# Patient Record
Sex: Male | Born: 1990 | Hispanic: Yes | Marital: Married
Health system: Southern US, Community
[De-identification: ages and names within clinical notes are randomized; demographics above are authoritative.]

---

## 2017-02-04 ENCOUNTER — Emergency Department
Admission: EM | Admit: 2017-02-04 | Discharge: 2017-02-04 | Disposition: A | Discharge status: Home / Self Care | Payer: Self-pay | Attending: Emergency Medicine | Admitting: Emergency Medicine

## 2017-02-04 ENCOUNTER — Emergency Department: Payer: Self-pay

## 2017-02-04 DIAGNOSIS — E1165 Type 2 diabetes mellitus with hyperglycemia: Secondary | ICD-10-CM | POA: Insufficient documentation

## 2017-02-04 DIAGNOSIS — F10921 Alcohol use, unspecified with intoxication delirium: Secondary | ICD-10-CM | POA: Insufficient documentation

## 2017-02-04 DIAGNOSIS — R739 Hyperglycemia, unspecified: Secondary | ICD-10-CM

## 2017-02-04 DIAGNOSIS — F1999 Other psychoactive substance use, unspecified with unspecified psychoactive substance-induced disorder: Secondary | ICD-10-CM | POA: Insufficient documentation

## 2017-02-04 DIAGNOSIS — F12988 Cannabis use, unspecified with other cannabis-induced disorder: Secondary | ICD-10-CM

## 2017-02-04 DIAGNOSIS — Z7984 Long term (current) use of oral hypoglycemic drugs: Secondary | ICD-10-CM | POA: Insufficient documentation

## 2017-02-04 LAB — GLUCOSE, CAPILLARY: GLUCOSE-CAPILLARY: 230 mg/dL — AB (ref 65–99)

## 2017-02-04 LAB — URINE DRUG SCREEN, QUALITATIVE (ARMC ONLY)
Amphetamines, Ur Screen: NOT DETECTED
BARBITURATES, UR SCREEN: NOT DETECTED
BENZODIAZEPINE, UR SCRN: NOT DETECTED
Cannabinoid 50 Ng, Ur ~~LOC~~: POSITIVE — AB
Cocaine Metabolite,Ur ~~LOC~~: NOT DETECTED
MDMA (Ecstasy)Ur Screen: NOT DETECTED
METHADONE SCREEN, URINE: NOT DETECTED
OPIATE, UR SCREEN: NOT DETECTED
Phencyclidine (PCP) Ur S: NOT DETECTED
Tricyclic, Ur Screen: NOT DETECTED

## 2017-02-04 LAB — CBC
HEMATOCRIT: 46.2 % (ref 40.0–52.0)
HEMOGLOBIN: 15.5 g/dL (ref 13.0–18.0)
MCH: 31.5 pg (ref 26.0–34.0)
MCHC: 33.6 g/dL (ref 32.0–36.0)
MCV: 93.8 fL (ref 80.0–100.0)
Platelets: 256 10*3/uL (ref 150–440)
RBC: 4.92 MIL/uL (ref 4.40–5.90)
RDW: 12.7 % (ref 11.5–14.5)
WBC: 11.2 10*3/uL — ABNORMAL HIGH (ref 3.8–10.6)

## 2017-02-04 LAB — COMPREHENSIVE METABOLIC PANEL
ALBUMIN: 4.7 g/dL (ref 3.5–5.0)
ALK PHOS: 53 U/L (ref 38–126)
ALT: 27 U/L (ref 17–63)
ANION GAP: 10 (ref 5–15)
AST: 45 U/L — ABNORMAL HIGH (ref 15–41)
BILIRUBIN TOTAL: 0.5 mg/dL (ref 0.3–1.2)
BUN: 11 mg/dL (ref 6–20)
CALCIUM: 8.8 mg/dL — AB (ref 8.9–10.3)
CO2: 27 mmol/L (ref 22–32)
Chloride: 100 mmol/L — ABNORMAL LOW (ref 101–111)
Creatinine, Ser: 1.24 mg/dL (ref 0.61–1.24)
GLUCOSE: 385 mg/dL — AB (ref 65–99)
Potassium: 4.2 mmol/L (ref 3.5–5.1)
Sodium: 137 mmol/L (ref 135–145)
TOTAL PROTEIN: 7.9 g/dL (ref 6.5–8.1)

## 2017-02-04 LAB — ETHANOL: ALCOHOL ETHYL (B): 209 mg/dL — AB (ref <5)

## 2017-02-04 MED ORDER — SODIUM CHLORIDE 0.9 % IV BOLUS (SEPSIS)
1000.0000 mL | Freq: Once | INTRAVENOUS | Status: AC
  Administered 2017-02-04: 1000 mL via INTRAVENOUS

## 2017-02-04 MED ORDER — METFORMIN HCL 500 MG PO TABS: 500.0000 mg | ORAL_TABLET | Freq: Two times a day (BID) | ORAL | 11 refills | Status: AC

## 2017-02-04 MED ORDER — NALOXONE HCL 2 MG/2ML IJ SOSY: 1.0000 mg | PREFILLED_SYRINGE | Freq: Once | INTRAMUSCULAR | Status: DC

## 2017-02-04 MED ORDER — NALOXONE HCL 2 MG/2ML IJ SOSY
PREFILLED_SYRINGE | INTRAMUSCULAR | Status: AC
  Filled 2017-02-04: qty 2

## 2017-02-04 MED ORDER — ONDANSETRON HCL 4 MG/2ML IJ SOLN
4.0000 mg | Freq: Once | INTRAMUSCULAR | Status: AC
  Administered 2017-02-04: 4 mg via INTRAVENOUS
  Filled 2017-02-04: qty 2

## 2017-02-04 NOTE — ED Notes (Signed)
Discharge instructions reviewd with pt by Dr Roxan Hockey in [place]

## 2017-02-04 NOTE — ED Notes (Signed)
Patient's O2 remaining between 90-93% on room air. Patient placed on 1L [place] with O2 sat of 96%

## 2017-02-04 NOTE — ED Notes (Signed)
Waiting on interpreter to discharge patient
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
## 2017-02-04 NOTE — ED Triage Notes (Signed)
Pt arrived via EMS from home unresponsive. Narcan was given nasally and IV with response. CPR was given to pt by family but was not needed because pt had a pulse. Pt is drunk and has been smoking weed. Pt placed on non-rebreather per EMS. VS per EMS O2-80s  BP-"good". Ammonia was used to help make pt more alert but did not work.

## 2017-02-04 NOTE — ED Notes (Signed)
Patient transported to MRI at this time. 

## 2017-02-04 NOTE — ED Notes (Signed)
Patient taken off of oxygen to obtain RA sat. Will continue to monitor

## 2017-02-04 NOTE — ED Provider Notes (Signed)
[facility] Emergency Department Provider Note    First MD Initiated Contact with Patient 02/04/17 [phone]     (approximate)  I have reviewed the triage vital signs and the nursing notes.  Level V caveat:alcohol intoxication altered mental status HISTORY  Chief Complaint Drug Overdose   HPI Gordon Shaffer is a 26 y.o. male presents to the emergency department via EMS. Per EMS patient was found at home unresponsive with bystander CPR being performed. EMS states that the patient had a palpable pulse on their arrival. Narcan was given nasally and IV with improvement in mental status. Patient denies any drug use patient denies any EtOH ingestion.   There are no active problems to display for this patient. past medical history Unknown   Past surgical history None  Prior to Admission medications   Not on File    Allergies no known drug allergies No family history on file.  Social History Social History  Substance Use Topics  . Smoking status: Not on file  . Smokeless tobacco: Not on file  . Alcohol use Not on file    Review of Systems Constitutional: No fever/chills Eyes: No visual changes. ENT: No sore throat. Cardiovascular: Denies chest pain. Respiratory: Denies shortness of breath. Gastrointestinal: No abdominal pain.  No nausea, no vomiting.  No diarrhea.  No constipation. Genitourinary: Negative for dysuria. Musculoskeletal: Negative for neck pain.  Negative for back pain. Integumentary: Negative for rash. Neurological: Negative for headaches, focal weakness or numbness. Psychiatric:altered mental status   ____________________________________________   PHYSICAL EXAM:  VITAL SIGNS: ED Triage Vitals [02/04/17 0525]  Enc Vitals Group     BP 139/84     Pulse Rate 99     Resp (!) 23     Temp      Temp src      SpO2 (!) 86 %     Weight      Height      Head Circumference      Peak Flow      Pain Score      Pain Loc    Pain Edu?      Excl. in GC?     Constitutional: somnolent but responsive to verbal stimuli. Appears intoxicated Eyes: Conjunctivae are normal.  Head: Atraumatic. Mouth/Throat: Mucous membranes are moist.  Oropharynx non-erythematous. Neck: No stridor.   Cardiovascular: Normal rate, regular rhythm. Good peripheral circulation. Grossly normal heart sounds. Respiratory: Normal respiratory effort.  No retractions. Lungs CTAB. Gastrointestinal: Soft and nontender. No distention.  Musculoskeletal: No lower extremity tenderness nor edema. No gross deformities of extremities. Neurologic:  Normal speech and language. No gross focal neurologic deficits are appreciated.  Skin:  Skin is warm, dry and intact. No rash noted.   ____________________________________________   LABS (all labs ordered are listed, but only abnormal results are displayed)  Labs Reviewed  CBC - Abnormal; Notable for the following:       Result Value   WBC 11.2 (*)    All other components within normal limits  COMPREHENSIVE METABOLIC PANEL - Abnormal; Notable for the following:    Chloride 100 (*)    Glucose, Bld 385 (*)    Calcium 8.8 (*)    AST 45 (*)    All other components within normal limits  ETHANOL - Abnormal; Notable for the following:    Alcohol, Ethyl (B) 209 (*)    All other components within normal limits  URINE DRUG SCREEN, QUALITATIVE ([facility] ONLY) - Abnormal; Notable for the  following:    Cannabinoid 50 Ng, Ur [place] POSITIVE (*)    All other components within normal limits  GLUCOSE, CAPILLARY - Abnormal; Notable for the following:    Glucose-Capillary 230 (*)    All other components within normal limits   ____________________________________________  EKG  ED ECG REPORT I, [place] N BROWN, the attending physician, personally viewed and interpreted this ECG.   Date: 02/04/2017  EKG Time: 5:24 AM  Rate: 101  Rhythm:sinus tachycardia  Axis: normal  Intervals:normal  ST&T Change:  none  ____________________________________________  RADIOLOGY I, [place] N BROWN, personally viewed and evaluated these images (plain radiographs) as part of my medical decision making, as well as reviewing the written report by the radiologist.  Ct Head Wo Contrast  Result Date: 02/04/2017 CLINICAL DATA:  Altered level of consciousness. Patient was found unresponsive. Patient responded to Narcan. EXAM: CT HEAD WITHOUT CONTRAST TECHNIQUE: Contiguous axial images were obtained from the base of the skull through the vertex without intravenous contrast. COMPARISON:  None. FINDINGS: Brain: Focal low-attenuation lesions in the basal ganglia bilaterally likely representing lacunar infarcts or possibly prominent perivascular spaces. Consider MRI for further evaluation to exclude acute process. There is no mass effect or midline shift. No abnormal extra-axial fluid collections. No ventricular dilatation. Gray-white matter junctions are distinct. Basal cisterns are not effaced. No acute intracranial hemorrhage. Vascular: No hyperdense vessel or unexpected calcification. Skull: Normal. Negative for fracture or focal lesion. Sinuses/Orbits: No acute finding. Other: None. IMPRESSION: Focally pains in the basal ganglia bilaterally suggesting lacunar infarcts. Consider MRI for further evaluation to exclude acute process. No acute intracranial hemorrhage or mass effect. Electronically Signed   By: Burman Nieves M.D.   On: 02/04/2017 06:28    ____________________________________________   PROCEDURES  Critical Care performed: CRITICAL CARE Performed by: Darci Current   Total critical care time: 45 minutes  Critical care time was exclusive of separately billable procedures and treating other patients.  Critical care was necessary to treat or prevent imminent or life-threatening deterioration.  Critical care was time spent personally by me on the following activities: development of treatment plan  with patient and/or surrogate as well as nursing, discussions with consultants, evaluation of patient's response to treatment, examination of patient, obtaining history from patient or surrogate, ordering and performing treatments and interventions, ordering and review of laboratory studies, ordering and review of radiographic studies, pulse oximetry and re-evaluation of patient's condition.    .Procedures   ____________________________________________   INITIAL IMPRESSION / ASSESSMENT AND PLAN / ED COURSE  Pertinent labs & imaging results that were available during my care of the patient were reviewed by me and considered in my medical decision making (see chart for details).  26 year old male presenting the emergency department with history of unresponsiveness now responsive to verbal stimuli. Patient appeared intoxicated on arrival which is confirmed on EtOH level of 209. In addition patient's urine drug screen positive for cannabinoids. Of note patient noted to be hyperglycemic with a glucose of 385 onlab data. Patient received 2 L IV normal saline will recheck glucose. Patient's care transferred to Dr. 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[facility])     MEDICATIONS GIVEN DURING THIS VISIT:  Medications  naloxone (NARCAN) injection 1 mg ( Intravenous Canceled Entry 02/04/17 0621)  sodium chloride 0.9 % bolus 1,000 mL (  1,000 mLs Intravenous New Bag/Given 02/04/17 0537)  ondansetron (ZOFRAN) injection 4 mg (4 mg Intravenous Given 02/04/17 0542)  sodium chloride 0.9 % bolus 1,000 mL (1,000 mLs Intravenous New Bag/Given 02/04/17 6433)     NEW OUTPATIENT MEDICATIONS STARTED DURING THIS VISIT:  New Prescriptions   No medications on file    Modified  Medications   No medications on file    Discontinued Medications   No medications on file     Note:  This document was prepared using Dragon voice recognition software and may include unintentional dictation errors.    Darci Current, MD 02/05/17 [phone]

## 2017-02-04 NOTE — ED Notes (Signed)
Pt placed on 4L nasal cannula because O2 in low 80s

## 2018-09-01 IMAGING — MR MR HEAD W/O CM
10 series · 41 of 48 positions shown · non-contrast
Comparison: Head CT 02/04/2017

CLINICAL DATA: Unexplained altered level of consciousness.

EXAM:
MRI HEAD WITHOUT CONTRAST
TECHNIQUE: Multiplanar, multiecho pulse sequences of the brain and surrounding
structures were obtained without intravenous contrast.

[Series 3: T1 · sagittal · 5.0mm · 0.45mm/px · 3 of 29 slices shown (1 of 2)]
[im 1/29]
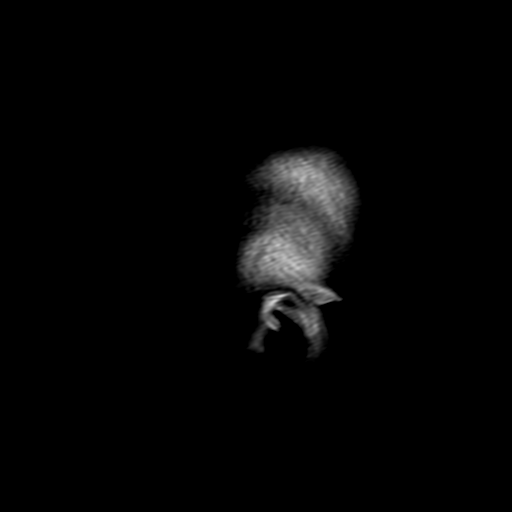
[im 15/29]
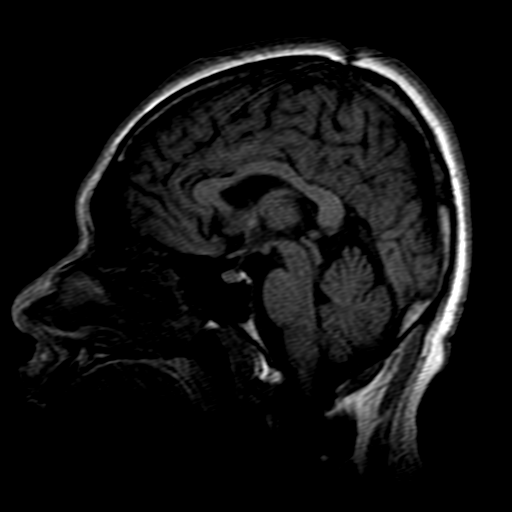
[im 29/29]
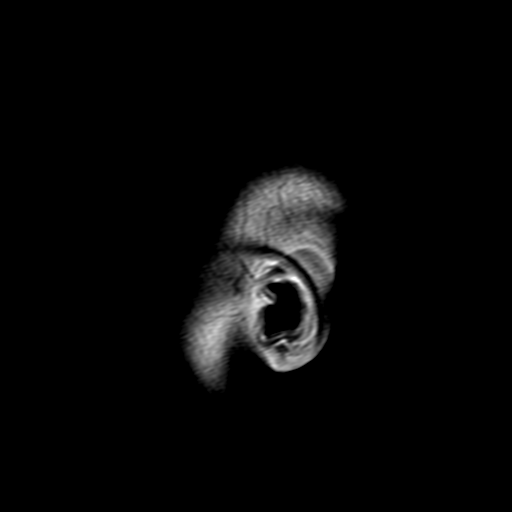

[Series 5: DWI · axial · 4.0mm · 0.94mm/px · z∈[-77,+95]mm · 5 of 44 slices shown (1 of 4)]
[im 1/44]
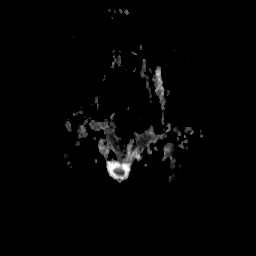
[im 11/44]
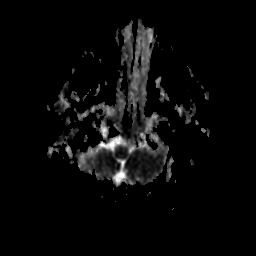
[im 22/44]
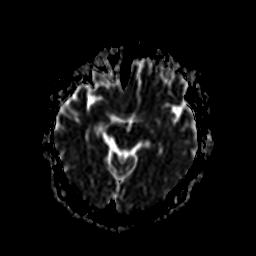
[im 33/44]
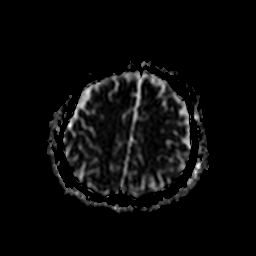
[im 44/44]
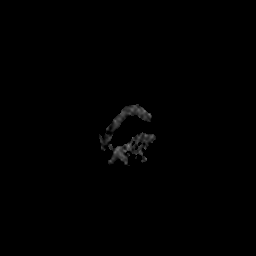

[Series 6: DWI · axial · 4.0mm · 0.87mm/px · z∈[-79,+93]mm · 6 of 45 slices shown (2 of 4)]
[im 1/45]
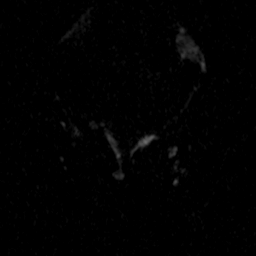
[im 9/45]
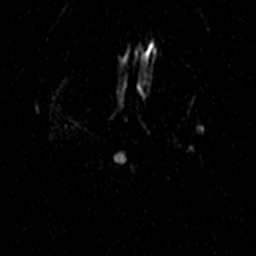
[im 18/45]
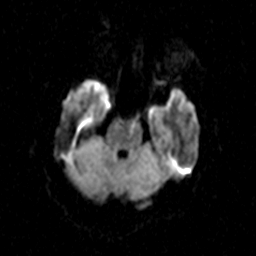
[im 27/45]
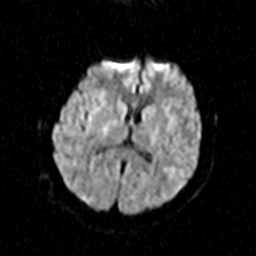
[im 36/45]
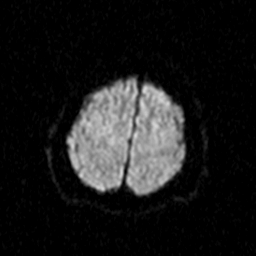
[im 45/45]
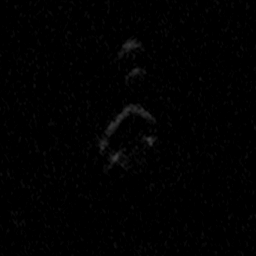

[Series 8: DWI · coronal · 5.0mm · 1.80mm/px · 5 of 39 slices shown (3 of 4)]
[im 1/39]
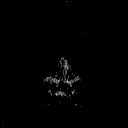
[im 10/39]
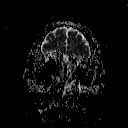
[im 20/39]
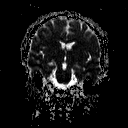
[im 29/39]
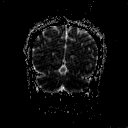
[im 39/39]
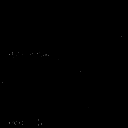

[Series 9: DWI · coronal · 5.0mm · 1.80mm/px · 5 of 34 slices shown (4 of 4)]
[im 1/34]
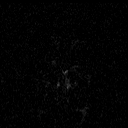
[im 9/34]
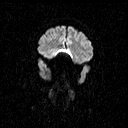
[im 17/34]
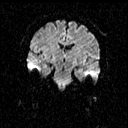
[im 25/34]
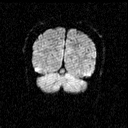
[im 34/34]
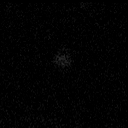

[Series 10: T2 · axial · 5.0mm · 0.45mm/px · z∈[-73,+92]mm · 4 of 27 slices shown (1 of 3)]
[im 1/27]
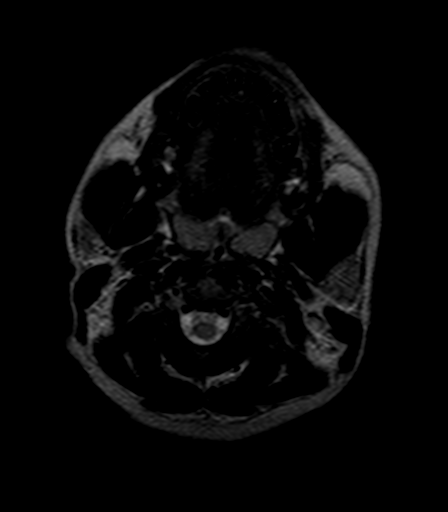
[im 9/27]
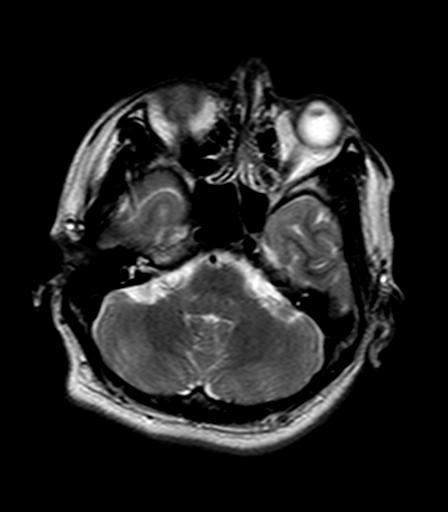
[im 18/27]
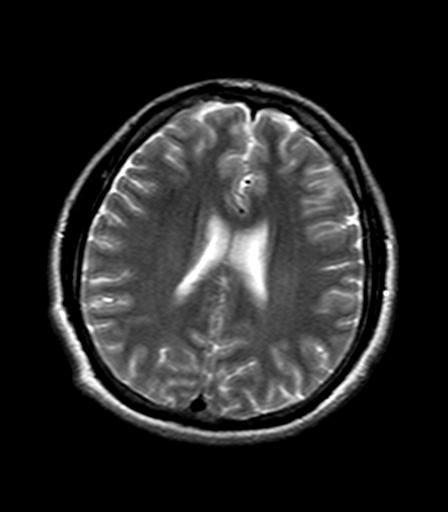
[im 27/27]
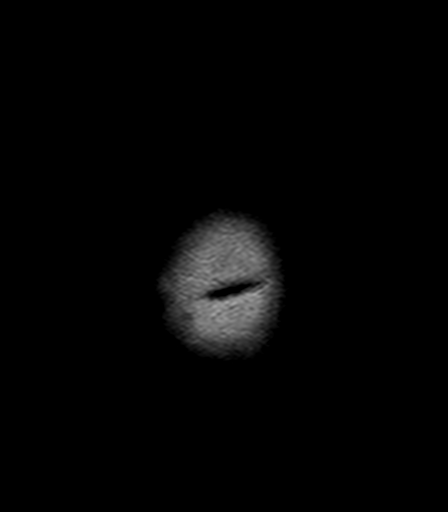

[Series 11: FLAIR · axial · 5.0mm · 0.90mm/px · z∈[-73,+92]mm · 4 of 27 slices shown]
[im 1/27]
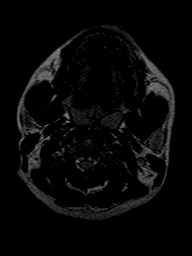
[im 9/27]
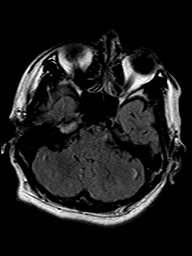
[im 18/27]
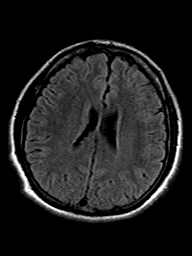
[im 27/27]
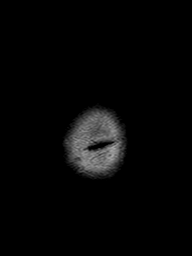

[Series 12: T2 · axial · 5.0mm · 0.45mm/px · z∈[-73,+92]mm · 4 of 27 slices shown (2 of 3)]
[im 1/27]
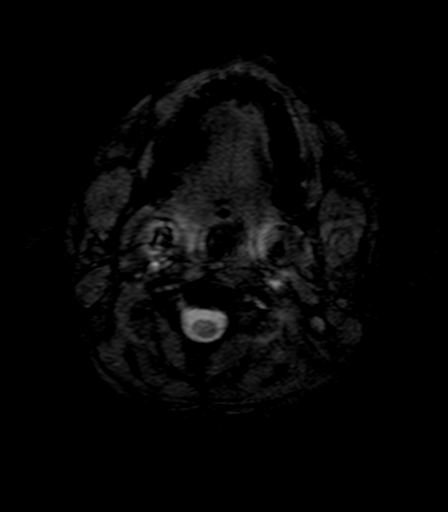
[im 9/27]
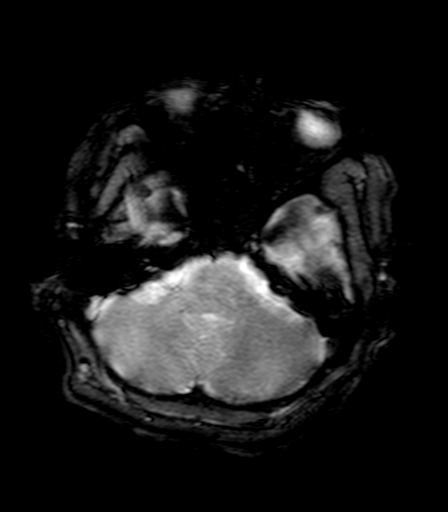
[im 18/27]
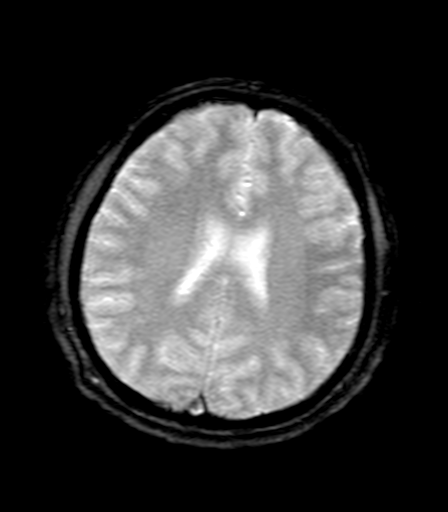
[im 27/27]
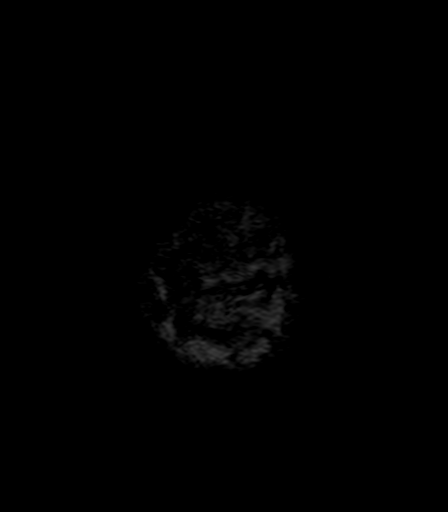

[Series 13: T1 · axial · 3.0mm · 0.45mm/px · 1 of 60 slices shown (2 of 2)]
[im 1/60]
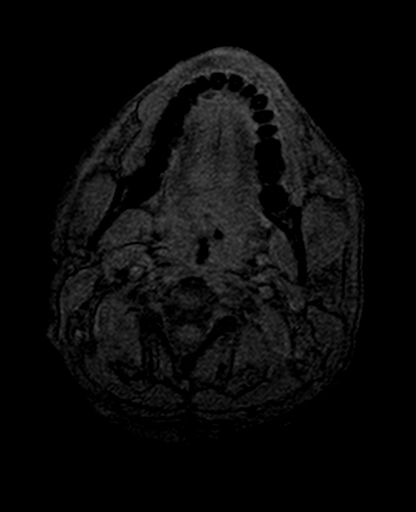

[Series 14: T2 · coronal · 5.0mm · 0.45mm/px · 4 of 29 slices shown (3 of 3)]
[im 1/29]
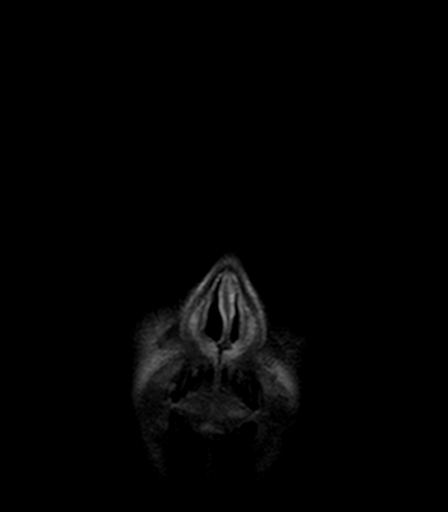
[im 10/29]
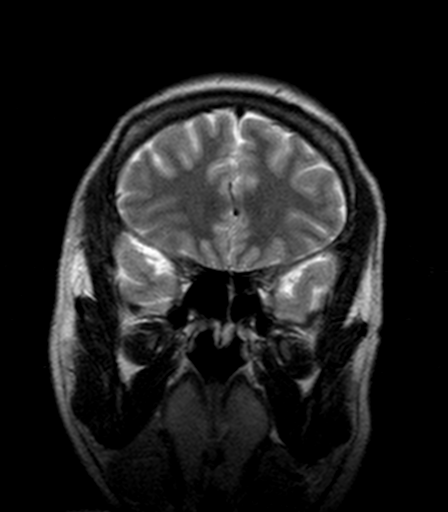
[im 19/29]
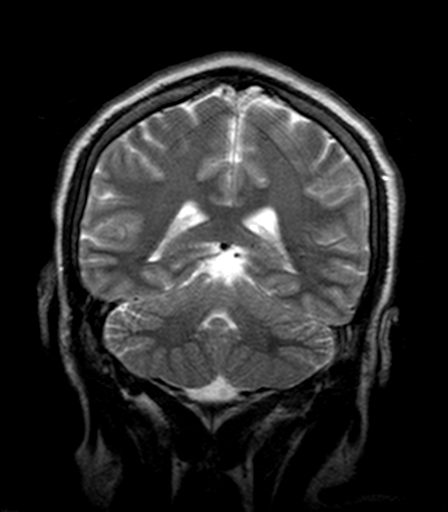
[im 29/29]
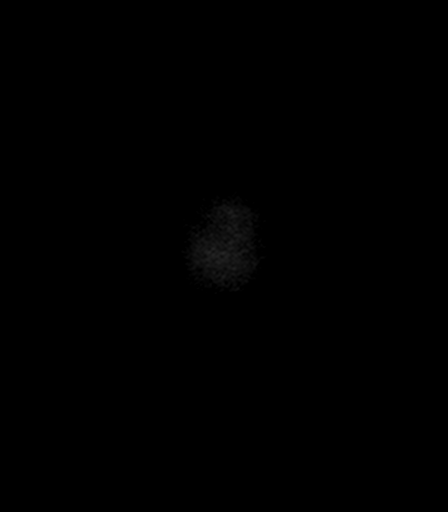

[41 of 48 positions shown; findings below may reference images not displayed]

FINDINGS: Brain: Bilateral globus pallidus lacunes consistent with with remote
infarctions. These are usually seen with previous toxic, metabolic,
or ischemic insults. No generalized ischemic injury or atrophy. No
acute infarct, hemorrhage, or hydrocephalus. No masslike findings.

Vascular: Major vessels are patent. Filling defect in the superior
sagittal sinus at the vertex is consistent with an arachnoid
granulation.

Skull and upper cervical spine: Negative for marrow lesion

Sinuses/Orbits: Negative for marrow lesion.

Other: Scalp scarring at the vertex.
IMPRESSION: 1. No acute finding.
2. Remote bilateral globus pallidus infarct as seen with prior
toxic, metabolic, or ischemic insult.
